# Patient Record
Sex: Female | Born: 1990 | Race: Black or African American | Hispanic: No | Marital: Single | Smoking: Never smoker
Health system: Southern US, Community
[De-identification: ages and names within clinical notes are randomized; demographics above are authoritative.]

---

## 2022-03-15 ENCOUNTER — Emergency Department (HOSPITAL_COMMUNITY): Payer: Medicaid - Out of State

## 2022-03-15 ENCOUNTER — Emergency Department (HOSPITAL_COMMUNITY)
Admission: EM | Admit: 2022-03-15 | Discharge: 2022-03-15 | Disposition: A | Discharge status: Home / Self Care | Payer: Medicaid - Out of State | Attending: Emergency Medicine | Admitting: Emergency Medicine

## 2022-03-15 ENCOUNTER — Other Ambulatory Visit: Payer: Self-pay

## 2022-03-15 ENCOUNTER — Encounter (HOSPITAL_COMMUNITY): Payer: Self-pay

## 2022-03-15 DIAGNOSIS — K529 Noninfective gastroenteritis and colitis, unspecified: Secondary | ICD-10-CM | POA: Insufficient documentation

## 2022-03-15 DIAGNOSIS — Z20822 Contact with and (suspected) exposure to covid-19: Secondary | ICD-10-CM | POA: Insufficient documentation

## 2022-03-15 DIAGNOSIS — D72829 Elevated white blood cell count, unspecified: Secondary | ICD-10-CM | POA: Insufficient documentation

## 2022-03-15 DIAGNOSIS — R1084 Generalized abdominal pain: Secondary | ICD-10-CM | POA: Diagnosis present

## 2022-03-15 LAB — URINALYSIS, ROUTINE W REFLEX MICROSCOPIC
Bilirubin Urine: NEGATIVE
Glucose, UA: NEGATIVE mg/dL
Hgb urine dipstick: NEGATIVE
Ketones, ur: NEGATIVE mg/dL
Leukocytes,Ua: NEGATIVE
Nitrite: NEGATIVE
Protein, ur: NEGATIVE mg/dL
Specific Gravity, Urine: 1.012 (ref 1.005–1.030)
pH: 5 (ref 5.0–8.0)

## 2022-03-15 LAB — COMPREHENSIVE METABOLIC PANEL
ALT: 18 U/L (ref 0–44)
AST: 22 U/L (ref 15–41)
Albumin: 4.3 g/dL (ref 3.5–5.0)
Alkaline Phosphatase: 76 U/L (ref 38–126)
Anion gap: 5 (ref 5–15)
BUN: 11 mg/dL (ref 6–20)
CO2: 22 mmol/L (ref 22–32)
Calcium: 9 mg/dL (ref 8.9–10.3)
Chloride: 109 mmol/L (ref 98–111)
Creatinine, Ser: 0.82 mg/dL (ref 0.44–1.00)
GFR, Estimated: >60 mL/min (ref >60)
Glucose, Bld: 91 mg/dL (ref 70–99)
Potassium: 4 mmol/L (ref 3.5–5.1)
Sodium: 136 mmol/L (ref 135–145)
Total Bilirubin: 0.9 mg/dL (ref 0.3–1.2)
Total Protein: 8.2 g/dL — ABNORMAL HIGH (ref 6.5–8.1)

## 2022-03-15 LAB — CBC
HCT: 39.8 % (ref 36.0–46.0)
Hemoglobin: 13.7 g/dL (ref 12.0–15.0)
MCH: 29.7 pg (ref 26.0–34.0)
MCHC: 34.4 g/dL (ref 30.0–36.0)
MCV: 86.3 fL (ref 80.0–100.0)
Platelets: 284 10*3/uL (ref 150–400)
RBC: 4.61 MIL/uL (ref 3.87–5.11)
RDW: 12.5 % (ref 11.5–15.5)
WBC: 16.7 10*3/uL — ABNORMAL HIGH (ref 4.0–10.5)
nRBC: 0 % (ref 0.0–0.2)

## 2022-03-15 LAB — RESP PANEL BY RT-PCR (FLU A&B, COVID) ARPGX2
Influenza A by PCR: NEGATIVE
Influenza B by PCR: NEGATIVE
SARS Coronavirus 2 by RT PCR: NEGATIVE

## 2022-03-15 LAB — LIPASE, BLOOD: Lipase: 23 U/L (ref 11–51)

## 2022-03-15 LAB — I-STAT BETA HCG BLOOD, ED (MC, WL, AP ONLY): I-stat hCG, quantitative: <5 m[IU]/mL (ref <5)

## 2022-03-15 MED ORDER — DICYCLOMINE HCL 10 MG PO CAPS
20.0000 mg | ORAL_CAPSULE | Freq: Once | ORAL | Status: AC
  Administered 2022-03-15: 20 mg via ORAL
  Filled 2022-03-15: qty 2

## 2022-03-15 MED ORDER — IOHEXOL 300 MG/ML  SOLN
100.0000 mL | Freq: Once | INTRAMUSCULAR | Status: AC | PRN
  Administered 2022-03-15: 100 mL via INTRAVENOUS

## 2022-03-15 MED ORDER — FAMOTIDINE 20 MG PO TABS
20.0000 mg | ORAL_TABLET | Freq: Once | ORAL | Status: AC
  Administered 2022-03-15: 20 mg via ORAL
  Filled 2022-03-15: qty 1

## 2022-03-15 MED ORDER — OXYCODONE-ACETAMINOPHEN 5-325 MG PO TABS
1.0000 | ORAL_TABLET | Freq: Once | ORAL | Status: AC
  Administered 2022-03-15: 1 via ORAL
  Filled 2022-03-15: qty 1

## 2022-03-15 MED ORDER — ALUM & MAG HYDROXIDE-SIMETH 200-200-20 MG/5ML PO SUSP
30.0000 mL | Freq: Once | ORAL | Status: AC
  Administered 2022-03-15: 30 mL via ORAL
  Filled 2022-03-15: qty 30

## 2022-03-15 NOTE — ED Provider Notes (Signed)
[facility] DEPT Provider Note   CSN: [id number] Arrival date & time: 03/15/22  0001     History  Chief Complaint  Patient presents with   Abdominal Pain    Cynthia Cummings is a 31 y.o. female.  HPI  Without significant medical history presents with complaints of generalized stomach pain and diarrhea.  Patient states this started today, first dose of stomach pain she states is all over, it is constant, does not radiate, no associated nausea or vomiting, states she has associated diarrhea has had about 6 episodes no melena or hematochezia.  She denies any urinary symptoms, states she has had a cholecystectomy no other abdominal surgeries denies alcohol use NSAID use no stomach ulcers bowel obstruction or pancreatitis.  She endorses allergic fever chills denies cough congestion general body aches    Home Medications Prior to Admission medications   Not on File      Allergies    Patient has no known allergies.    Review of Systems   Review of Systems  Constitutional:  Negative for chills and fever.  Respiratory:  Negative for shortness of breath.   Cardiovascular:  Negative for chest pain.  Gastrointestinal:  Positive for abdominal pain and diarrhea. Negative for nausea and vomiting.  Neurological:  Negative for headaches.   Physical Exam Updated Vital Signs BP 109/61   Pulse 92   Temp 99.4 F (37.4 C) (Oral)   Resp 16   Ht 5' 7"  (1.702 m)   Wt 136.1 kg   SpO2 98%   BMI 46.99 kg/m  Physical Exam Vitals and nursing note reviewed.  Constitutional:      General: She is not in acute distress.    Appearance: She is not ill-appearing.  HENT:     Head: Normocephalic and atraumatic.     Nose: No congestion.  Eyes:     Conjunctiva/sclera: Conjunctivae normal.  Cardiovascular:     Rate and Rhythm: Normal rate and regular rhythm.     Pulses: Normal pulses.     Heart sounds: No murmur heard.   No friction rub. No gallop.  Pulmonary:      Effort: No respiratory distress.     Breath sounds: No wheezing, rhonchi or rales.  Abdominal:     Palpations: Abdomen is soft.     Tenderness: There is abdominal tenderness. There is no right CVA tenderness or left CVA tenderness.     Comments: Nondistended no active bowel sounds dull to percussion, tenderness is mainly on the left upper and lower side, without guarding rebound tenderness or peritoneal sign negative Murphy's or McBurney point no CVA tenderness.  Musculoskeletal:     Right lower leg: No edema.     Left lower leg: No edema.  Skin:    General: Skin is warm and dry.  Neurological:     Mental Status: She is alert.  Psychiatric:        Mood and Affect: Mood normal.    ED Results / Procedures / Treatments   Labs (all labs ordered are listed, but only abnormal results are displayed) Labs Reviewed  COMPREHENSIVE METABOLIC PANEL - Abnormal; Notable for the following components:      Result Value   Total Protein 8.2 (*)    All other components within normal limits  CBC - Abnormal; Notable for the following components:   WBC 16.7 (*)    All other components within normal limits  RESP PANEL BY RT-PCR (FLU A&B, COVID) ARPGX2  LIPASE,  BLOOD  URINALYSIS, ROUTINE W REFLEX MICROSCOPIC  I-STAT BETA HCG BLOOD, ED ([facility], [facility], [facility] ONLY)    EKG None  Radiology CT ABDOMEN PELVIS W CONTRAST  Result Date: 03/15/2022 CLINICAL DATA:  Diverticulitis with complication is suspected. EXAM: CT ABDOMEN AND PELVIS WITH CONTRAST TECHNIQUE: Multidetector CT imaging of the abdomen and pelvis was performed using the standard protocol following bolus administration of intravenous contrast. RADIATION DOSE REDUCTION: This exam was performed according to the departmental dose-optimization program which includes automated exposure control, adjustment of the mA and/or kV according to patient size and/or use of iterative reconstruction technique. CONTRAST:  155m OMNIPAQUE IOHEXOL 300 MG/ML  SOLN COMPARISON:   None Available. FINDINGS: Lower chest: No acute abnormality. The cardiac size is normal. Mild elevation right hemidiaphragm. Hepatobiliary: 21 cm length mildly steatotic liver. No mass enhancement. Surgically absent gallbladder without biliary dilatation. Pancreas: There is a stent in the pancreatic duct extending from the proximal duct in the pancreatic neck into the descending duodenum. There is no ductal dilatation, mass enhancement or adjacent inflammatory change. Spleen: No mass or splenomegaly. Adrenals/Urinary Tract: There is no adrenal mass or renal cortical mass with mild fetal lobation in both kidneys. There is no urinary stone or obstruction. The bladder unremarkable for the degree of distention. Stomach/Bowel: There are mild thickened folds in the stomach, left abdominal small bowel consistent with gastroenteritis. There are fluid-filled normal caliber lower abdominal small bowel segments with mucosal enhancement without thickening. The appendix is normal caliber. There is fluid in the ascending and transverse colon. Left-sided colon with scattered diverticula. Image resolution is limited in the pelvis due to beam hardening related to patient body habitus. There are no obvious acute inflammatory changes associated with the diverticulosis. Subtle inflammatory change would be missed. Vascular/Lymphatic: No significant vascular findings are present. No enlarged abdominal or pelvic lymph nodes. There are multiple bilateral pelvic phleboliths. Reproductive: The uterus is intact.  The ovaries are normal in size. Other: There is trace low-density fluid in the pelvic cul-de-sac nonspecific but usually physiologic at this age. There is tiny umbilical fat hernia. There is no incarcerated hernia. There is no free hemorrhage, abscess, or free air. Musculoskeletal: There are right-greater-than-left changes of chronic nonerosive sacroiliitis. There are mild degenerative changes of the lower lumbar spine. No  concerning regional skeletal lesions. IMPRESSION: 1. Scattered diverticulosis along the left colon but no obvious acute inflammatory change. Image resolution is limited in the pelvis. 2. Gastroenteritis, including with fluid in the ascending and transverse colon. No mesenteric inflammatory changes or adenopathy. 3. Mildly prominent mildly steatotic liver. 4. Previous stenting of the pancreatic duct. No ductal dilatation or inflammatory change. 5. Right-greater-than-left chronic nonerosive sacroiliitis. Electronically Signed   By: KTelford NabM.D.   On: 03/15/2022 03:32    Procedures Procedures    Medications Ordered in ED Medications  dicyclomine (BENTYL) capsule 20 mg (20 mg Oral Given 03/15/22 0138)  famotidine (PEPCID) tablet 20 mg (20 mg Oral Given 03/15/22 0138)  alum & mag hydroxide-simeth (MAALOX/MYLANTA) 200-200-20 MG/5ML suspension 30 mL (30 mLs Oral Given 03/15/22 0138)  oxyCODONE-acetaminophen (PERCOCET/ROXICET) 5-325 MG per tablet 1 tablet (1 tablet Oral Given 03/15/22 0231)  iohexol (OMNIPAQUE) 300 MG/ML solution 100 mL (100 mLs Intravenous Contrast Given 03/15/22 0257)    ED Course/ Medical Decision Making/ A&P                           Medical Decision Making Amount and/or Complexity of Data Reviewed  Labs: ordered. Radiology: ordered.  Risk OTC drugs. Prescription drug management.   This patient presents to the ED for concern of abdominal pain, this involves an extensive number of treatment options, and is a complaint that carries with it a high risk of complications and morbidity.  The differential diagnosis includes bowel obstruction, diverticulitis, volvulus    Additional history obtained:  Additional history obtained from N/A External records from outside source obtained and reviewed including N/A   Co morbidities that complicate the patient evaluation  Obesity  Social Determinants of Health:  N/A    Lab Tests:  I Ordered, and personally interpreted  labs.  The pertinent results include: CBC shows leukocytosis of 16.7, CMP unremarkable, lipase 23, hCG negative, UA negative   Imaging Studies ordered:  I ordered imaging studies including CT on pelvis I independently visualized and interpreted imaging which showed negative acute findings I agree with the radiologist interpretation   Cardiac Monitoring:  The patient was maintained on a cardiac monitor.  I personally viewed and interpreted the cardiac monitored which showed an underlying rhythm of: N/A   Medicines ordered and prescription drug management:  I ordered medication including Pepcid, GI cocktail, Bentyl I have reviewed the patients home medicines and have made adjustments as needed  Critical Interventions:  N/A   Reevaluation:  Presents with abdominal pain tenderness is being in the left upper and lower side no guarding rebound tenderness or peritoneal sign unclear etiology possible diverticulitis versus gastroenteritis we will obtain lab work prior the pain medication and reassess.  Reassessed she still continue to have pain, she has leukocytosis with left upper quadrant tenderness I am concerned for possible diverticulitis, will obtain CT imaging for further evaluation.  Reassessed update on lab work and imaging she was resting comfortably vital signs remained stable she is agreement discharge at this time    Consultations Obtained:  N/A    Test Considered:  N/A    Rule out Low suspicion for UTI, pyelo-, kidney stone denies any urinary symptoms, UA negative for signs of infection or hematuria.  Low suspicion for pancreatitis lipase within normal limits.  Low suspicion for biliary abnormality no elevation liver enzymes alk phos T. bili.  Low suspicion for complicated diverticulitis, bowel obstruction, volvulus, abdominal infection CT imaging is negative these findings.  I have low suspicion for AAA/dissection as presentation is atypical of  etiology.    Dispostion and problem list  After consideration of the diagnostic results and the patients response to treatment, I feel that the patent would benefit from discharge.  Gastroenteritis-likely viral nature, will defer on antibiotics as she has no bloody stools, she is afebrile nontachycardic, will recommend bowel rest, follow-up with PCP as needed strict return precautions.            Final Clinical Impression(s) / ED Diagnoses Final diagnoses:  Gastroenteritis    Rx / DC Orders ED Discharge Orders     None         Marcello Fennel, PA-C 03/15/22 0344    Fatima Blank, MD 03/15/22 [phone]

## 2022-03-15 NOTE — ED Notes (Signed)
Labeled specimen cup given to pt for U/A collection per MD order. [employer] 

## 2022-03-15 NOTE — ED Triage Notes (Signed)
Pt presents to ED from home with c/o of generalized abdominal pain that began this morning. Pt endorses nausea and several episodes of diarrhea.

## 2022-03-15 NOTE — Discharge Instructions (Signed)
Lab work imaging was reassuring, recommend bland diet, increase your diet as tolerated.  May use over-the-counter Imodium as needed for diarrhea.  Please follow-up your PCP for further eval  Come back to the emergency department if you develop chest pain, shortness of breath, severe abdominal pain, uncontrolled nausea, vomiting, diarrhea.

## 2023-04-23 IMAGING — CT CT ABD-PELV W/ CM
2 of 4 series · 15 of 46 positions shown, 17 images · IV contrast (agent unspecified)
Comparison: None Available.

CLINICAL DATA: Diverticulitis with complication is suspected.

EXAM:
CT ABDOMEN AND PELVIS WITH CONTRAST
TECHNIQUE: Multidetector CT imaging of the abdomen and pelvis was performed
using the standard protocol following bolus administration of
intravenous contrast.

[Series 2: axial st · axial · 0.83mm/px · z∈[+1137,+1552]mm · 12 of 95 slices shown, 14 images]
[im 6/95  soft-tissue]
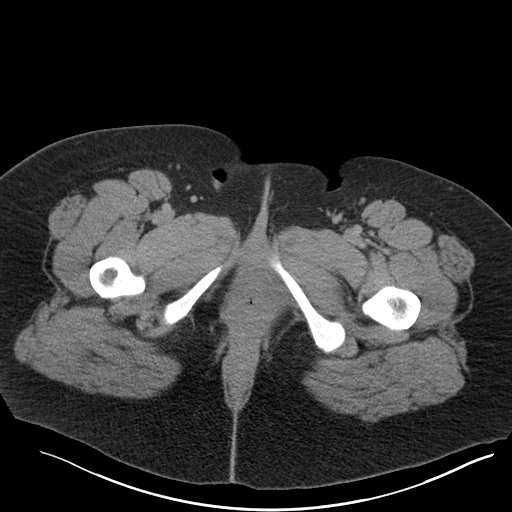
[im 6/95  bone]
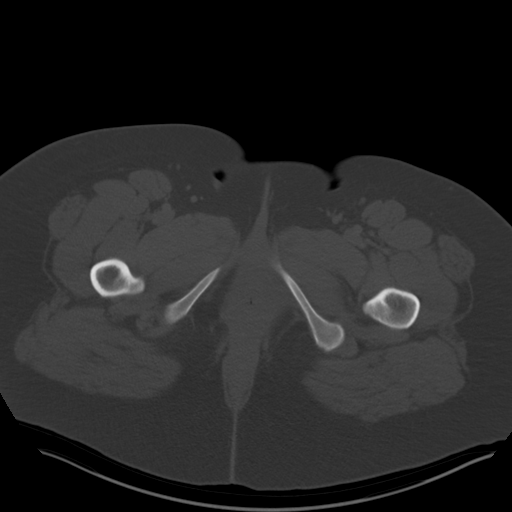
[im 17/95  soft-tissue]
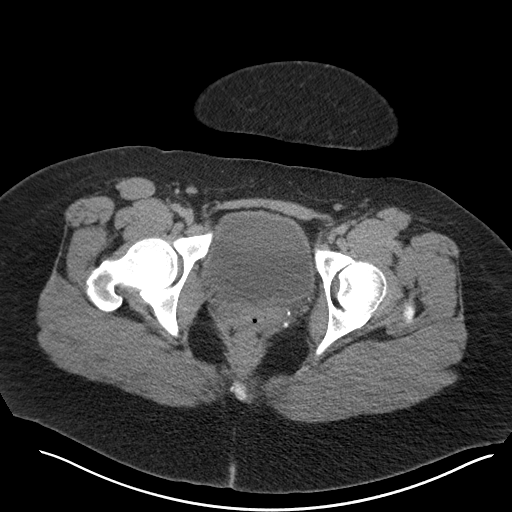
[im 23/95  soft-tissue]
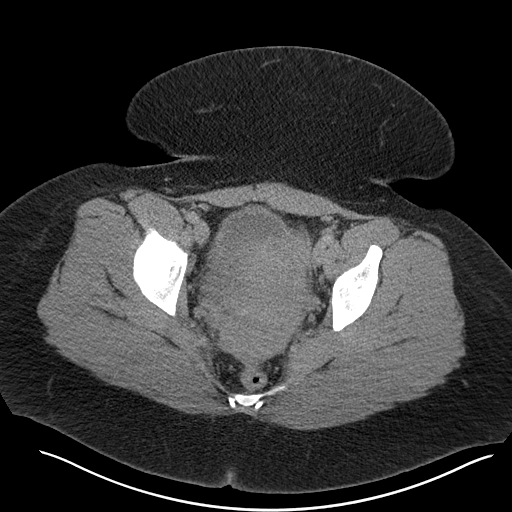
[im 28/95  soft-tissue]
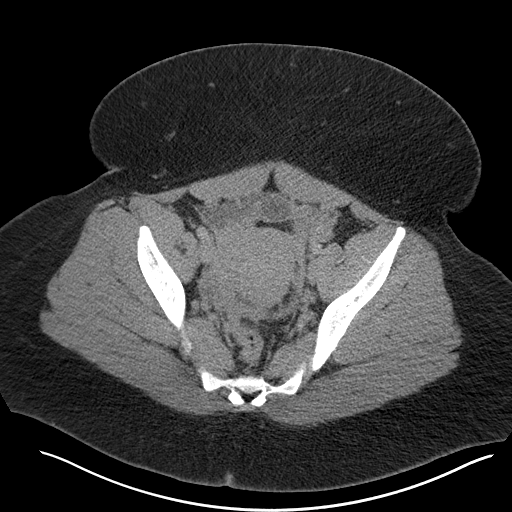
[im 39/95  soft-tissue]
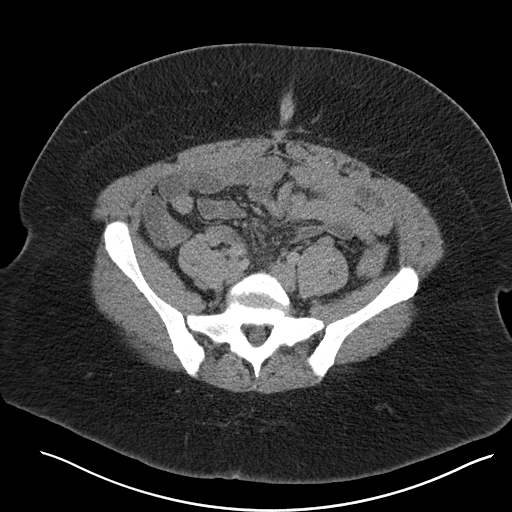
[im 45/95  soft-tissue]
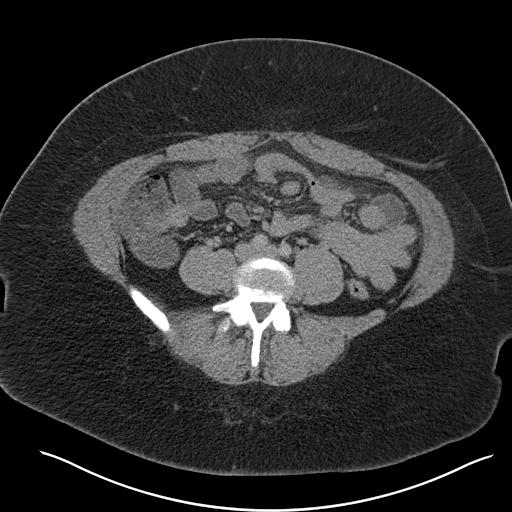
[im 50/95  soft-tissue]
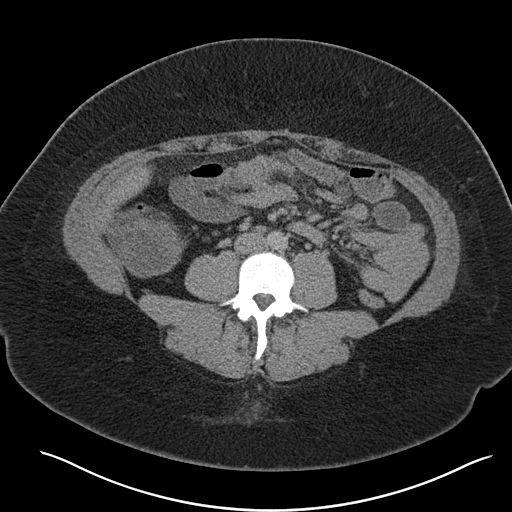
[im 61/95  soft-tissue]
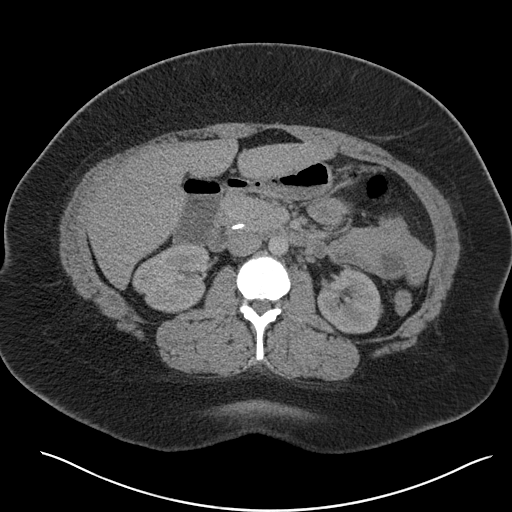
[im 67/95  soft-tissue]
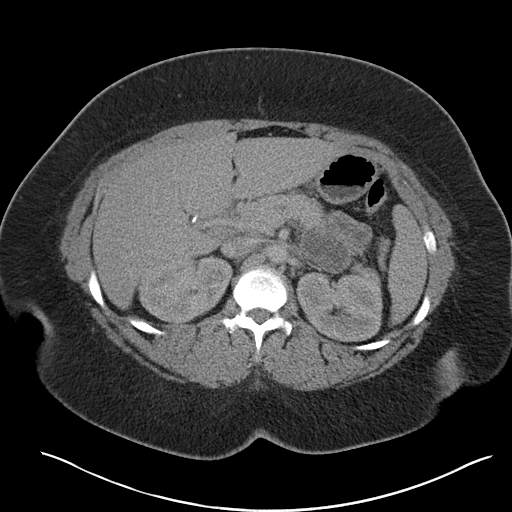
[im 67/95  bone]
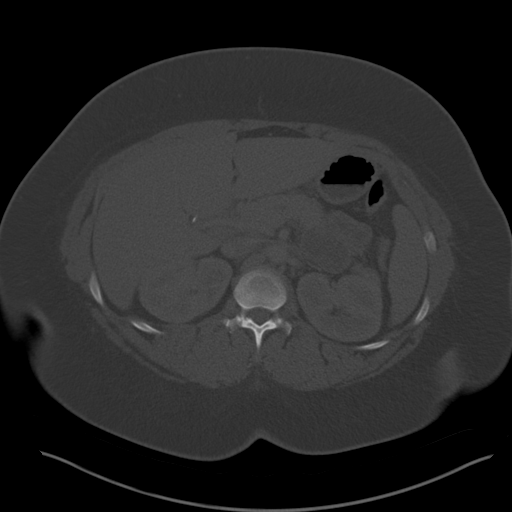
[im 72/95  soft-tissue]
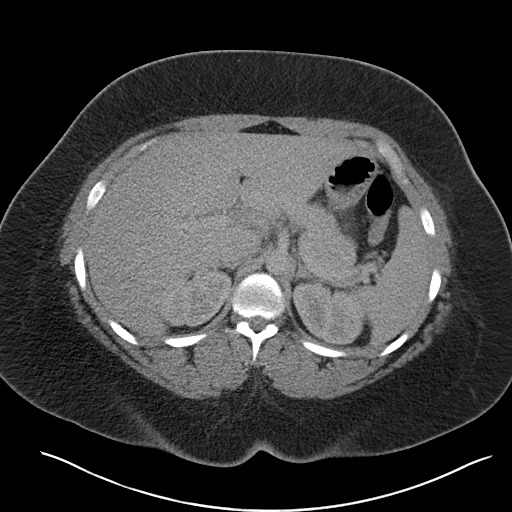
[im 83/95  soft-tissue]
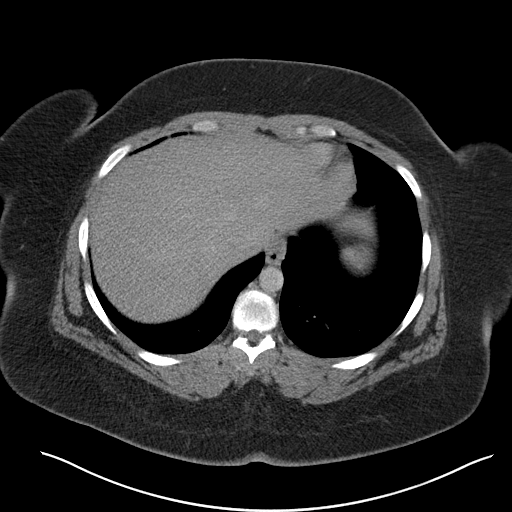
[im 89/95  soft-tissue]
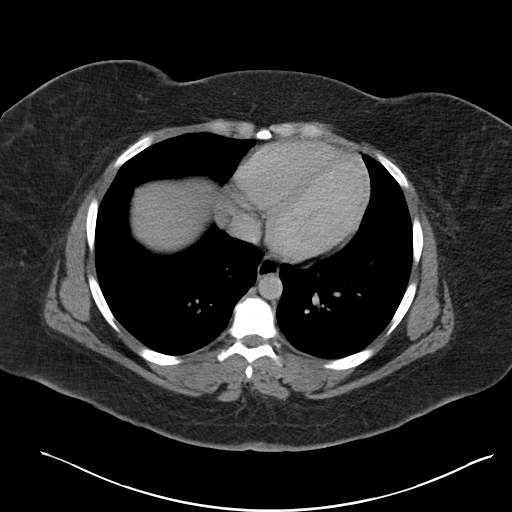

[Series 5: coronal st · coronal · 0.96mm/px · 3 of 182 slices shown]
[im 61/182  soft-tissue]
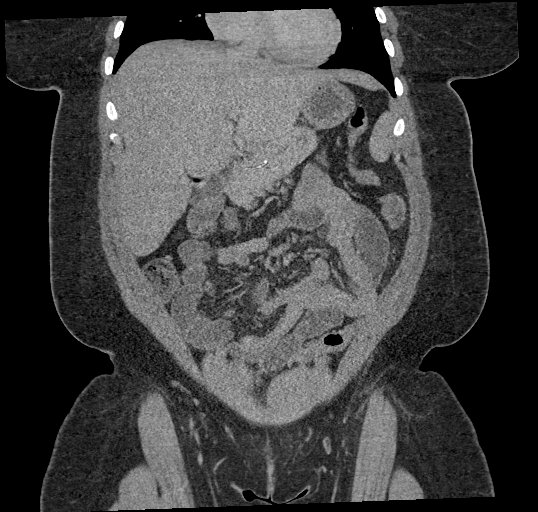
[im 81/182  soft-tissue]
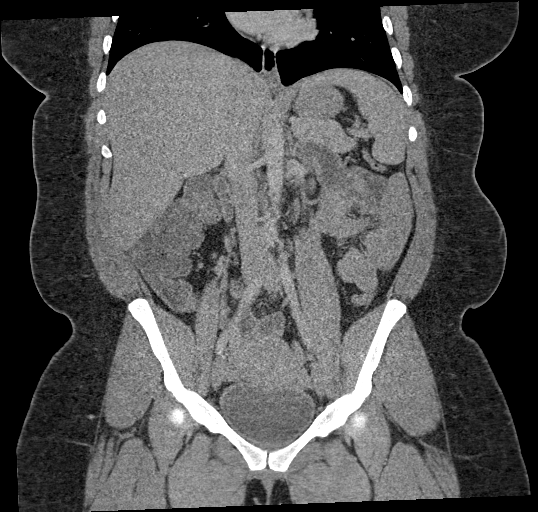
[im 101/182  soft-tissue]
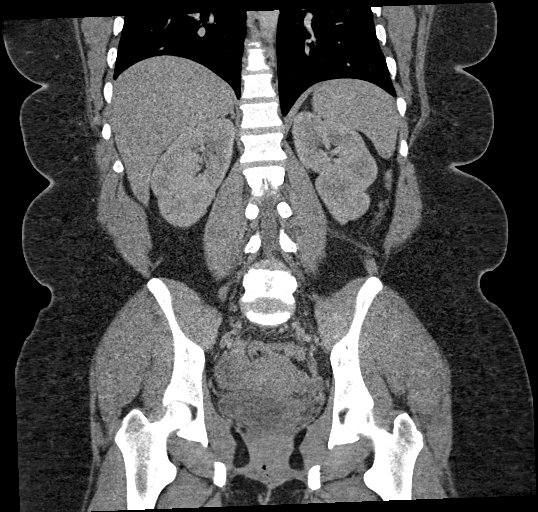

[15 of 46 positions shown; findings below may reference images not displayed]

RADIATION DOSE REDUCTION: This exam was performed according to the
departmental dose-optimization program which includes automated
exposure control, adjustment of the mA and/or kV according to
patient size and/or use of iterative reconstruction technique.

CONTRAST:  100mL OMNIPAQUE IOHEXOL 300 MG/ML  SOLN
FINDINGS: Lower chest: No acute abnormality. The cardiac size is normal. Mild
elevation right hemidiaphragm.

Hepatobiliary: 21 cm length mildly steatotic liver. No mass
enhancement. Surgically absent gallbladder without biliary
dilatation.

Pancreas: There is a stent in the pancreatic duct extending from the
proximal duct in the pancreatic neck into the descending duodenum.
There is no ductal dilatation, mass enhancement or adjacent
inflammatory change.

Spleen: No mass or splenomegaly.

Adrenals/Urinary Tract: There is no adrenal mass or renal cortical
mass with mild fetal lobation in both kidneys. There is no urinary
stone or obstruction. The bladder unremarkable for the degree of
distention.

Stomach/Bowel: There are mild thickened folds in the stomach, left
abdominal small bowel consistent with gastroenteritis. There are
fluid-filled normal caliber lower abdominal small bowel segments
with mucosal enhancement without thickening.

The appendix is normal caliber. There is fluid in the ascending and
transverse colon. Left-sided colon with scattered diverticula.

Image resolution is limited in the pelvis due to beam hardening
related to patient body habitus.

There are no obvious acute inflammatory changes associated with the
diverticulosis. Subtle inflammatory change would be missed.

Vascular/Lymphatic: No significant vascular findings are present. No
enlarged abdominal or pelvic lymph nodes. There are multiple
bilateral pelvic phleboliths.

Reproductive: The uterus is intact.  The ovaries are normal in size.

Other: There is trace low-density fluid in the pelvic cul-de-sac
nonspecific but usually physiologic at this age. There is tiny
umbilical fat hernia. There is no incarcerated hernia. There is no
free hemorrhage, abscess, or free air.

Musculoskeletal: There are right-greater-than-left changes of
chronic nonerosive sacroiliitis. There are mild degenerative changes
of the lower lumbar spine. No concerning regional skeletal lesions.
IMPRESSION: 1. Scattered diverticulosis along the left colon but no obvious
acute inflammatory change. Image resolution is limited in the
pelvis.
2. Gastroenteritis, including with fluid in the ascending and
transverse colon. No mesenteric inflammatory changes or adenopathy.
3. Mildly prominent mildly steatotic liver.
4. Previous stenting of the pancreatic duct. No ductal dilatation or
inflammatory change.
5. Right-greater-than-left chronic nonerosive sacroiliitis.
